# Patient Record
Sex: Male | Born: 1970 | Race: White | Hispanic: No | Marital: Married | Smoking: Never smoker
Health system: Southern US, Community
[De-identification: ages and names within clinical notes are randomized; demographics above are authoritative.]

## PROBLEM LIST (undated history)

## (undated) DIAGNOSIS — Z789 Other specified health status: Secondary | ICD-10-CM

## (undated) HISTORY — PX: OTHER SURGICAL HISTORY: SHX169

## (undated) HISTORY — PX: APPENDECTOMY: SHX54

---

## 2000-08-09 ENCOUNTER — Emergency Department (HOSPITAL_COMMUNITY): Admission: EM | Admit: 2000-08-09 | Discharge: 2000-08-09 | Payer: Self-pay | Admitting: Emergency Medicine

## 2000-08-09 ENCOUNTER — Encounter: Payer: Self-pay | Admitting: Emergency Medicine

## 2005-06-04 ENCOUNTER — Encounter (INDEPENDENT_AMBULATORY_CARE_PROVIDER_SITE_OTHER): Payer: Self-pay | Admitting: Specialist

## 2005-06-04 ENCOUNTER — Inpatient Hospital Stay (HOSPITAL_COMMUNITY): Admission: EM | Admit: 2005-06-04 | Discharge: 2005-06-05 | Payer: Self-pay | Admitting: Emergency Medicine

## 2010-03-29 ENCOUNTER — Encounter: Admission: RE | Admit: 2010-03-29 | Discharge: 2010-03-29 | Payer: Self-pay | Admitting: Otolaryngology
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
## 2011-04-04 NOTE — H&P (Signed)
NAME:  Shannon Norris, Shannon Norris NO.:  [id number]   MEDICAL RECORD NO.:  [id number]          PATIENT TYPE:  EMS   LOCATION:  MAJO                         FACILITY:  [facility]   PHYSICIAN:  Sandria Bales. Ezzard Standing, M.D.  DATE OF BIRTH:  October 21, 1971   DATE OF ADMISSION:  06/03/2005  DATE OF DISCHARGE:                                HISTORY & PHYSICAL   HISTORY OF PRESENT ILLNESS:  This is a 40 year old white male who is a  patient of Dr. Henrine [facility] though apparently he usually goes to the [facility]. He is a little bit unsure of who his physician was. Anyway,  yesterday afternoon about 4 or 5 o'clock he developed sudden abdominal pain  which was mid abdomen. His wife was going to drive [facility] but he passed out in the car and she then diverted him to the  [facility] room.   He had maybe a little nausea after he passed out but no vomiting. He has had  no prior gastrointestinal history. He denies any history of peptic ulcer  disease, liver disease pancreatic disease, colon disease or any prior  abdominal surgery. Because of the peculiar kind of history and presentation,  he underwent a CT scan. I reviewed the CT scan with [facility] and this  shows what appears to be an appendicitis in a retrocecal appendix.   PAST MEDICAL HISTORY:   ALLERGIES:  He has no allergies.   MEDICATIONS:  His only medication includes Zoloft 100 mg daily.   REVIEW OF SYSTEMS:  NEUROLOGIC:  No seizure or loss of consciousness.  PULMONARY:  He does not smoke cigarettes, notes no pneumonia, tuberculosis.  CARDIAC:  He has no history of heart disease, chest pain or hypertension.  GASTROINTESTINAL:  See history of present illness.  UROLOGIC:  No history of kidney infections.   FAMILY HISTORY:  His mother apparently did have appendicitis.   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   PHYSICAL  EXAMINATION:  VITAL SIGNS:  Temperature is 98.1, blood pressure  96/61, pulse is 56.  GENERAL:  He is a red haired, mustached male.  HEENT:  Unremarkable.  NECK:  Supple without mass or thyromegaly.  LUNGS:  Clear to auscultation.  HEART:  Regular rate and rhythm, I hear no murmur or rub.  ABDOMEN:  He is tender with guarding and rebound at the right lower quadrant  to the right flank. He has no hernia.  GENITALIA:  He has no testicular mass or hernia.  EXTREMITIES:  Has good reflexes in all four extremities.  NEUROLOGIC:  He is grossly intact.   LABORATORY DATA:  White blood count is 10,900, hemoglobin 14.7. Sodium 138,  potassium 4.0.   DISCHARGE DIAGNOSIS:  Appendicitis.   Discussed with the patient and his wife the indications, potential  complications of the surgery. I told him that I usually could do this  laparoscopically but there is a chance of open surgery, also the chance the  CT  scan could be wrong and he would have another diagnosis intra-  abdominally. I discussed the risk of bleeding, infection, and postoperative  recovery.       DHN/MEDQ  D:  06/04/2005  T:  06/04/2005  Job:  [id number]   cc:   [facility]. [facility], M.D.  [address]  [place]  [place] [address]  Fax: [phone]
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
## 2011-04-04 NOTE — Op Note (Signed)
[name], Shannon NO.:  [id number]   MEDICAL RECORD NO.:  [id number]          PATIENT TYPE:  INP   LOCATION:  1832                         FACILITY:  [facility]   PHYSICIAN:  Sandria Bales. Ezzard Standing, M.D.  DATE OF BIRTH:  [birth year]   DATE OF PROCEDURE:  06/04/2005  DATE OF DISCHARGE:                                 OPERATIVE REPORT   PREOPERATIVE DIAGNOSIS:  Acute appendicitis.   POSTOPERATIVE DIAGNOSIS:  Acute appendicitis.   PROCEDURE:  Laparoscopic appendectomy.   SURGEON:  Sandria Bales. Ezzard Standing, M.D.   ASSISTANT:  None.   ANESTHESIA:  General endotracheal.   ESTIMATED BLOOD LOSS:  Minimal.   INDICATIONS FOR PROCEDURE:  Mr. Alarie is a 40 year old white male who  has presented with the acute onset of abdominal pain, which is localized to  the right lower quadrant, and a CT scan is consistent with acute  appendicitis.  The patient now comes in for an attempted laparoscopic  appendectomy.  The indications and potential complications were explained to  the patient and to his wife.  The potential complications include but not  limited to bleeding, infection, necessity for open surgery, and the  possibility that it could be another diagnosis.   DESCRIPTION OF PROCEDURE:  The patient was placed in the supine position and  given a general endotracheal anesthetic.  His abdomen was prepped with  Betadine solution and sterilely draped.  A Foley catheter was placed.  The  left arm was tucked.  He was given 1 g of cefoxitin at the initiation of the  procedure.  An infra-umbilical incision was made, and with sharp dissection  was carried down to the abdominal cavity.  This was a Hasson trocar.  A 5 mm  trocar was placed in the right subcostal location.  A 10 mm trocar in the  left lower quadrant.   An abdominal exploration was carried out, revealing the right and left lobes  of the liver were unremarkable.  The anterior wall of the stomach  unremarkable.  The patient has  a very tiny left inguinal  hernia which has  probably not been symptomatic and may never be.   The patient had an inflammatory mass in the right lower quadrant consistent  with acute appendicitis.  The appendix was grasped.  The appendix was partly  retrocecal and partly attached to the right colonic gutter and side wall.  This was taken down with the harmonic scalpel.  The mesentery of the  appendix divided with the harmonic scalpel.  The base of the appendix was  divided with a 30 mm Endo-GI vascular staple.  The appendix was placed into  an Endo-Catch bag and delivered through the umbilicus.  The wound was then  irrigated.  Hemostasis again controlled with the harmonic scalpel.  Each  trocar was removed in turn without bleeding from the trocar sites.  The  umbilical port was closed with a #0 Vicryl suture.  The skin site was closed  with a #4-0 Vicryl suture and painted with tincture of Benzoin and Steri-  Strips.   The patient tolerated the procedure well.  He was transported to the  recovery room in good condition.  The sponge and needle counts were correct  at the end of the case.      Sandria Bales. Ezzard Standing, M.D.  Electronically Signed     DHN/MEDQ  D:  06/04/2005  T:  06/04/2005  Job:  [id number]   cc:   Chales Salmon. Abigail Miyamoto, M.D.  [address]  [place]  [place] [address]  Fax: [phone]

## 2011-07-29 ENCOUNTER — Other Ambulatory Visit: Payer: Self-pay | Admitting: Family Medicine

## 2016-10-20 ENCOUNTER — Ambulatory Visit: Payer: Self-pay | Admitting: Orthopedic Surgery

## 2016-10-31 ENCOUNTER — Ambulatory Visit: Payer: Self-pay | Admitting: Orthopedic Surgery

## 2016-10-31 NOTE — H&P (Signed)
[facility] is an 45 y.o. male.   Chief Complaint: back and RLE pain HPI: The patient is a 45 year old male who presents today for follow up of their back. The patient is being followed for their low back symptoms. They are now 9 week(s) out from injury. Symptoms reported today include: pain (low back) and leg pain (right). Current treatment includes: NSAIDs (Vimovo) and Flexeril. The following medication has been used for pain control: antiinflammatory medication. The patient reports their current pain level to be 5 / 10 (but can be 10/10). The patient has not gotten any relief of their symptoms with corticosteroid use or physical therapy.  [facility] reports his epidural is scheduled December 6. He said he has tried therapy medications. He is miserable. He does not want to wait and undergo an epidural. It radiates down in the legs. He is occasionally tripping.  Review of systems is negative for fevers, chest pain, shortness of breath, unexplained recent weight loss, loss of bowel or bladder function, burning with urination, joint swelling, rashes, weakness or numbness, difficulty with balance, easy bruising, excessive thirst or frequent urination.  No past medical history on file.  No past surgical history on file.  No family history on file. Social History:  has no tobacco, alcohol, and drug history on file.  Allergies: Allergies not on file   (Not in a hospital admission)  No results found for this or any previous visit (from the past 48 hour(s)). No results found.  Review of Systems  Constitutional: Negative.   HENT: Negative.   Eyes: Negative.   Respiratory: Negative.   Cardiovascular: Negative.   Gastrointestinal: Negative.   Genitourinary: Negative.   Musculoskeletal: Positive for back pain.  Skin: Negative.   Neurological: Positive for sensory change and focal weakness.  Psychiatric/Behavioral: Negative.     There were no vitals taken for this  visit. Physical Exam  Constitutional: He is oriented to person, place, and time. He appears well-developed.  HENT:  Head: Normocephalic.  Eyes: Pupils are equal, round, and reactive to light.  Neck: Normal range of motion.  Cardiovascular: Normal rate.   Respiratory: Effort normal.  GI: Soft.  Musculoskeletal:  On exam, he is healthy, moderate distress. Mood and affect is appropriate. Straight leg raise on the right produces buttock, thigh, and calf pain. Negative on the left. EHL is 4+/5 on the right compared to the left. He has pain with flexion and extension of the lumbar spine.  Lumbar spine exam reveals no evidence of soft tissue swelling, deformity or skin ecchymosis. On palpation there is no tenderness of the lumbar spine. No flank pain with percussion. The abdomen is soft and nontender. Nontender over the trochanters. No cellulitis or lymphadenopathy.  Straight leg raise on the right produces buttock, thigh, and calf pain. Negative on the left. EHL is 4+/5 on the right compared to the left. He has pain with flexion and extension of the lumbar spine. Motor is 5/5 including tibialis anterior, plantar flexion, quadriceps and hamstrings. Patient is normoreflexic. There is no Babinski or clonus. Sensory exam is intact to light touch. Patient has good distal pulses. No DVT. No pain and normal range of motion without instability of the hips, knees and ankles.  Neurological: He is alert and oriented to person, place, and time.  Skin: Skin is warm and dry.    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 Some disk degeneration noted as well. Physical therapy note was reviewed as  well.  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  As previously discussed, we discussed microlumbar decompression. I had an extensive discussion of the risks and benefits of the lumbar  decompression with the patient including bleeding, infection, damage to neurovascular structures, epidural fibrosis, CSF leak requiring repair. We also discussed increase in pain, adjacent segment disease, recurrent disc herniation, need for future surgery including repeat decompression and/or fusion. We also discussed risks of postoperative hematoma, paralysis, anesthetic complications including DVT, PE, death, cardiopulmonary dysfunction. In addition, the perioperative and postoperative courses were discussed in detail including the rehabilitative time and return to functional activity and work. I provided the patient with an illustrated handout and utilized the appropriate surgical models.  He has had over nine weeks of symptoms, failure of conservative treatment, activity modification, analgesics, presence of a neural compressive lesion L4-L5 and disc herniations, it is reasonable to proceed with microlumbar decompression. We discussed the postoperative course in detail and the postoperative restrictions and specifically the concern about recurrent disc herniation, progressive disc degeneration requiring fusion. I will proceed as soon as possible.  Plan microlumbar decompression L4-5 right  [facility], Shannon SparkLYN M., PA-C for Dr. Shelle IronBeane 10/31/2016, 9:00 AM

## 2016-11-14 ENCOUNTER — Encounter (HOSPITAL_COMMUNITY): Payer: Self-pay

## 2016-11-14 ENCOUNTER — Encounter (HOSPITAL_COMMUNITY)
Admission: RE | Admit: 2016-11-14 | Discharge: 2016-11-14 | Disposition: A | Discharge status: Home / Self Care | Payer: BLUE CROSS/BLUE SHIELD | Source: Ambulatory Visit | Attending: Specialist | Admitting: Specialist

## 2016-11-14 ENCOUNTER — Ambulatory Visit (HOSPITAL_COMMUNITY)
Admission: RE | Admit: 2016-11-14 | Discharge: 2016-11-14 | Disposition: A | Discharge status: Home / Self Care | Payer: BLUE CROSS/BLUE SHIELD | Source: Ambulatory Visit | Attending: Orthopedic Surgery | Admitting: Orthopedic Surgery

## 2016-11-14 DIAGNOSIS — M5126 Other intervertebral disc displacement, lumbar region: Secondary | ICD-10-CM | POA: Diagnosis not present

## 2016-11-14 DIAGNOSIS — Z01818 Encounter for other preprocedural examination: Secondary | ICD-10-CM | POA: Diagnosis present

## 2016-11-14 DIAGNOSIS — Z01812 Encounter for preprocedural laboratory examination: Secondary | ICD-10-CM | POA: Insufficient documentation

## 2016-11-14 HISTORY — DX: Other specified health status: Z78.9

## 2016-11-14 LAB — CBC
HCT: 43.8 % (ref 39.0–52.0)
Hemoglobin: 15.1 g/dL (ref 13.0–17.0)
MCH: 30.9 pg (ref 26.0–34.0)
MCHC: 34.5 g/dL (ref 30.0–36.0)
MCV: 89.8 fL (ref 78.0–100.0)
PLATELETS: 199 10*3/uL (ref 150–400)
RBC: 4.88 MIL/uL (ref 4.22–5.81)
RDW: 12.7 % (ref 11.5–15.5)
WBC: 4.4 10*3/uL (ref 4.0–10.5)

## 2016-11-14 LAB — BASIC METABOLIC PANEL
Anion gap: 7 (ref 5–15)
BUN: 19 mg/dL (ref 6–20)
CALCIUM: 8.9 mg/dL (ref 8.9–10.3)
CHLORIDE: 108 mmol/L (ref 101–111)
CO2: 26 mmol/L (ref 22–32)
CREATININE: 0.83 mg/dL (ref 0.61–1.24)
GFR calc non Af Amer: >60 mL/min (ref >60)
GLUCOSE: 99 mg/dL (ref 65–99)
Potassium: 4.4 mmol/L (ref 3.5–5.1)
Sodium: 141 mmol/L (ref 135–145)

## 2016-11-14 LAB — SURGICAL PCR SCREEN
MRSA, PCR: NEGATIVE
Staphylococcus aureus: NEGATIVE

## 2016-11-14 LAB — ABO/RH: ABO/RH(D): AB POS

## 2016-11-14 NOTE — Patient Instructions (Signed)
Shannon Norris  11/14/2016   Your procedure is scheduled on: Wednesday November 19, 2016  Report [facility]  Entrance [place]  elevators to 3rd floor to  Short Stay [facility] at 6:30 AM.  Call this number if you have problems the morning of surgery [phone]   Remember: ONLY 1 PERSON MAY GO WITH YOU TO SHORT STAY TO GET  READY MORNING OF YOUR SURGERY.  Do not eat food or drink liquids :After Midnight.     Take these medicines the morning of surgery: NONE                                You may not have any metal on your body including hair pins and              piercings  Do not wear jewelry, lotions, powders or colognes, deodorant              Men may shave face and neck.   Do not bring valuables to the hospital.  IS NOT             RESPONSIBLE   FOR VALUABLES.  Contacts, dentures or bridgework may not be worn into surgery.  Leave suitcase in the car. After surgery it may be brought to your room.                Please read over the following fact sheets you were given:MRSA INFORMATION SHEET; INCENTIVE SPIROMETER  _____________________________________________________________________             [facility] Pre[facility] Before surgery, you can play an important role.  Because skin is not sterile, your skin needs to be as free of germs as possible.  You can reduce the number of germs on your skin by washing with CHG (chlorahexidine gluconate) soap before surgery.  CHG is an antiseptic cleaner which kills germs and bonds with the skin to continue killing germs even after washing. Please DO NOT use if you have an allergy to CHG or antibacterial soaps.  If your skin becomes reddened/irritated stop using the CHG and inform your nurse when you arrive at Short Stay. Do not shave (including legs and underarms) for at least 48 hours prior to the first CHG shower.  You may shave your face/neck. Please follow these instructions  carefully:  1.  Shower with CHG Soap the night before surgery and the  morning of Surgery.  2.  If you choose to wash your hair, wash your hair first as usual with your  normal  shampoo.  3.  After you shampoo, rinse your hair and body thoroughly to remove the  shampoo.                           4.  Use CHG as you would any other liquid soap.  You can apply chg directly  to the skin and wash                       Gently with a scrungie or clean washcloth.  5.  Apply the CHG Soap to your body ONLY FROM THE NECK DOWN.   Do not use on face/ open  Wound or open sores. Avoid contact with eyes, ears mouth and genitals (private parts).                       Wash face,  Genitals (private parts) with your normal soap.             6.  Wash thoroughly, paying special attention to the area where your surgery  will be performed.  7.  Thoroughly rinse your body with warm water from the neck down.  8.  DO NOT shower/wash with your normal soap after using and rinsing off  the CHG Soap.                9.  Pat yourself dry with a clean towel.            10.  Wear clean pajamas.            11.  Place clean sheets on your bed the night of your first shower and do not  sleep with pets. Day of Surgery : Do not apply any lotions/deodorants the morning of surgery.  Please wear clean clothes to the hospital/surgery [facility].  FAILURE TO FOLLOW THESE INSTRUCTIONS MAY RESULT IN THE CANCELLATION OF YOUR SURGERY PATIENT SIGNATURE_________________________________  NURSE SIGNATURE__________________________________  ________________________________________________________________________   Shannon [facility]  An incentive spirometer is a tool that can help keep your lungs clear and active. This tool measures how well you are filling your lungs with each breath. Taking long deep breaths may help reverse or decrease the chance of developing breathing (pulmonary) problems (especially infection)  following:  A long period of time when you are unable to move or be active. BEFORE THE PROCEDURE   If the spirometer includes an indicator to show your best effort, your nurse or respiratory therapist will set it to a desired goal.  If possible, sit up straight or lean slightly forward. Try not to slouch.  Hold the incentive spirometer in an upright position. INSTRUCTIONS FOR USE  1. Sit on the edge of your bed if possible, or sit up as far as you can in bed or on a chair. 2. Hold the incentive spirometer in an upright position. 3. Breathe out normally. 4. Place the mouthpiece in your mouth and seal your lips tightly around it. 5. Breathe in slowly and as deeply as possible, raising the piston or the ball toward the top of the column. 6. Hold your breath for 3-5 seconds or for as long as possible. Allow the piston or ball to fall to the bottom of the column. 7. Remove the mouthpiece from your mouth and breathe out normally. 8. Rest for a few seconds and repeat Steps 1 through 7 at least 10 times every 1-2 hours when you are awake. Take your time and take a few normal breaths between deep breaths. 9. The spirometer may include an indicator to show your best effort. Use the indicator as a goal to work toward during each repetition. 10. After each set of 10 deep breaths, practice coughing to be sure your lungs are clear. If you have an incision (the cut made at the time of surgery), support your incision when coughing by placing a pillow or rolled up towels firmly against it. Once you are able to get out of bed, walk around indoors and cough well. You may stop using the incentive spirometer when instructed by your caregiver.  RISKS AND COMPLICATIONS  Take your time so you do not get  dizzy or light-headed.  If you are in pain, you may need to take or ask for pain medication before doing incentive spirometry. It is harder to take a deep breath if you are having pain. AFTER USE  Rest and  breathe slowly and easily.  It can be helpful to keep track of a log of your progress. Your caregiver can provide you with a simple table to help with this. If you are using the spirometer at home, follow these instructions: [place] IF:   You are having difficultly using the spirometer.  You have trouble using the spirometer as often as instructed.  Your pain medication is not giving enough relief while using the spirometer.  You develop fever of 100.5 F (38.1 C) or higher. SEEK IMMEDIATE MEDICAL CARE IF:   You cough up bloody sputum that had not been present before.  You develop fever of 102 F (38.9 C) or greater.  You develop worsening pain at or near the incision site. MAKE SURE YOU:   Understand these instructions.  Will watch your condition.  Will get help right away if you are not doing well or get worse. Document Released: 03/16/2007 Document Revised: 01/26/2012 Document Reviewed: 05/17/2007 [facility] Patient Information 2014 [place], [place].   ________________________________________________________________________

## 2016-11-18 NOTE — Anesthesia Preprocedure Evaluation (Addendum)
Anesthesia Evaluation  Patient identified by MRN, date of birth, ID band Patient awake    Reviewed: Allergy & Precautions, NPO status , Patient's Chart, lab work & pertinent test results  Airway Mallampati: II  TM Distance: >3 FB Neck ROM: Full    Dental  (+) Teeth Intact, Dental Advisory Given   Pulmonary neg pulmonary ROS,    Pulmonary exam normal breath sounds clear to auscultation       Cardiovascular Exercise Tolerance: Good negative cardio ROS Normal cardiovascular exam Rhythm:Regular Rate:Normal     Neuro/Psych  HNP L4-L5 Right negative psych ROS   GI/Hepatic negative GI ROS, Neg liver ROS,   Endo/Other  Obesity   Renal/GU negative Renal ROS     Musculoskeletal negative musculoskeletal ROS (+)   Abdominal   Peds  Hematology negative hematology ROS (+)   Anesthesia Other Findings Day of surgery medications reviewed with the patient.  Reproductive/Obstetrics                            Anesthesia Physical Anesthesia Plan  ASA: II  Anesthesia Plan: General   Post-op Pain Management:    Induction: Intravenous  Airway Management Planned: Oral ETT  Additional Equipment:   Intra-op Plan:   Post-operative Plan: Extubation in OR  Informed Consent: I have reviewed the patients History and Physical, chart, labs and discussed the procedure including the risks, benefits and alternatives for the proposed anesthesia with the patient or authorized representative who has indicated his/her understanding and acceptance.   Dental advisory given  Plan Discussed with: CRNA  Anesthesia Plan Comments: (Risks/benefits of general anesthesia discussed with patient including risk of damage to teeth, lips, gum, and tongue, nausea/vomiting, allergic reactions to medications, and the possibility of heart attack, stroke and death.  All patient questions answered.  Patient wishes to proceed.)         Anesthesia Quick Evaluation

## 2016-11-19 ENCOUNTER — Ambulatory Visit (HOSPITAL_COMMUNITY): Payer: BLUE CROSS/BLUE SHIELD | Admitting: Anesthesiology

## 2016-11-19 ENCOUNTER — Encounter (HOSPITAL_COMMUNITY): Payer: Self-pay | Admitting: Certified Registered Nurse Anesthetist

## 2016-11-19 ENCOUNTER — Ambulatory Visit (HOSPITAL_COMMUNITY): Payer: BLUE CROSS/BLUE SHIELD

## 2016-11-19 ENCOUNTER — Encounter (HOSPITAL_COMMUNITY)
Admission: RE | Disposition: A | Discharge status: Home / Self Care | Payer: Self-pay | Source: Ambulatory Visit | Attending: Specialist

## 2016-11-19 ENCOUNTER — Ambulatory Visit (HOSPITAL_COMMUNITY)
Admission: RE | Admit: 2016-11-19 | Discharge: 2016-11-19 | Disposition: A | Discharge status: Home / Self Care | Payer: BLUE CROSS/BLUE SHIELD | Source: Ambulatory Visit | Attending: Specialist | Admitting: Specialist

## 2016-11-19 DIAGNOSIS — M5126 Other intervertebral disc displacement, lumbar region: Secondary | ICD-10-CM | POA: Diagnosis present

## 2016-11-19 DIAGNOSIS — M48061 Spinal stenosis, lumbar region without neurogenic claudication: Secondary | ICD-10-CM | POA: Diagnosis present

## 2016-11-19 DIAGNOSIS — Z6831 Body mass index (BMI) 31.0-31.9, adult: Secondary | ICD-10-CM | POA: Diagnosis not present

## 2016-11-19 DIAGNOSIS — Z419 Encounter for procedure for purposes other than remedying health state, unspecified: Secondary | ICD-10-CM

## 2016-11-19 DIAGNOSIS — E669 Obesity, unspecified: Secondary | ICD-10-CM | POA: Insufficient documentation

## 2016-11-19 HISTORY — PX: LUMBAR LAMINECTOMY/DECOMPRESSION MICRODISCECTOMY: SHX5026

## 2016-11-19 LAB — TYPE AND SCREEN
ABO/RH(D): AB POS
Antibody Screen: NEGATIVE

## 2016-11-19 SURGERY — LUMBAR LAMINECTOMY/DECOMPRESSION MICRODISCECTOMY 1 LEVEL
Anesthesia: General | Site: Back | Laterality: Right

## 2016-11-19 MED ORDER — SODIUM CHLORIDE 0.9 % IR SOLN
Status: DC | PRN
  Administered 2016-11-19: 500 mL

## 2016-11-19 MED ORDER — MIDAZOLAM HCL 5 MG/5ML IJ SOLN
INTRAMUSCULAR | Status: DC | PRN
  Administered 2016-11-19: 2 mg via INTRAVENOUS

## 2016-11-19 MED ORDER — ROCURONIUM BROMIDE 50 MG/5ML IV SOSY
PREFILLED_SYRINGE | INTRAVENOUS | Status: AC
  Filled 2016-11-19: qty 5

## 2016-11-19 MED ORDER — PHENOL 1.4 % MT LIQD: 1.0000 | OROMUCOSAL | Status: DC | PRN

## 2016-11-19 MED ORDER — HYDROMORPHONE HCL 1 MG/ML IJ SOLN: 0.5000 mg | INTRAMUSCULAR | Status: DC | PRN

## 2016-11-19 MED ORDER — SUCCINYLCHOLINE CHLORIDE 200 MG/10ML IV SOSY
PREFILLED_SYRINGE | INTRAVENOUS | Status: AC
  Filled 2016-11-19: qty 10

## 2016-11-19 MED ORDER — ONDANSETRON HCL 4 MG/2ML IJ SOLN: 4.0000 mg | INTRAMUSCULAR | Status: DC | PRN

## 2016-11-19 MED ORDER — LIDOCAINE 2% (20 MG/ML) 5 ML SYRINGE
INTRAMUSCULAR | Status: AC
  Filled 2016-11-19: qty 5

## 2016-11-19 MED ORDER — SODIUM CHLORIDE 0.9 % IR SOLN
Status: AC
  Filled 2016-11-19: qty 500000

## 2016-11-19 MED ORDER — CEFAZOLIN SODIUM-DEXTROSE 2-4 GM/100ML-% IV SOLN
INTRAVENOUS | Status: AC
  Filled 2016-11-19: qty 100

## 2016-11-19 MED ORDER — LIDOCAINE 2% (20 MG/ML) 5 ML SYRINGE
INTRAMUSCULAR | Status: DC | PRN
  Administered 2016-11-19: 100 mg via INTRAVENOUS

## 2016-11-19 MED ORDER — ACETAMINOPHEN 650 MG RE SUPP: 650.0000 mg | RECTAL | Status: DC | PRN

## 2016-11-19 MED ORDER — OXYCODONE-ACETAMINOPHEN 5-325 MG PO TABS
1.0000 | ORAL_TABLET | ORAL | Status: DC | PRN
  Administered 2016-11-19: 18:00:00 2 via ORAL
  Administered 2016-11-19: 1 via ORAL
  Filled 2016-11-19: qty 1
  Filled 2016-11-19: qty 2

## 2016-11-19 MED ORDER — METHOCARBAMOL 500 MG PO TABS
500.0000 mg | ORAL_TABLET | Freq: Four times a day (QID) | ORAL | Status: DC | PRN
  Administered 2016-11-19: 17:00:00 500 mg via ORAL
  Filled 2016-11-19: qty 1

## 2016-11-19 MED ORDER — DOCUSATE SODIUM 100 MG PO CAPS: 100.0000 mg | ORAL_CAPSULE | Freq: Two times a day (BID) | ORAL | 1 refills | Status: AC | PRN

## 2016-11-19 MED ORDER — METHOCARBAMOL 1000 MG/10ML IJ SOLN
500.0000 mg | Freq: Four times a day (QID) | INTRAVENOUS | Status: DC | PRN
  Administered 2016-11-19: 500 mg via INTRAVENOUS
  Filled 2016-11-19: qty 5
  Filled 2016-11-19: qty 550

## 2016-11-19 MED ORDER — DOCUSATE SODIUM 100 MG PO CAPS: 100.0000 mg | ORAL_CAPSULE | Freq: Two times a day (BID) | ORAL | Status: DC

## 2016-11-19 MED ORDER — ROCURONIUM BROMIDE 50 MG/5ML IV SOSY
PREFILLED_SYRINGE | INTRAVENOUS | Status: DC | PRN
  Administered 2016-11-19: 10 mg via INTRAVENOUS
  Administered 2016-11-19: 40 mg via INTRAVENOUS

## 2016-11-19 MED ORDER — CEFAZOLIN SODIUM-DEXTROSE 2-4 GM/100ML-% IV SOLN
2.0000 g | INTRAVENOUS | Status: AC
  Administered 2016-11-19: 2 g via INTRAVENOUS

## 2016-11-19 MED ORDER — THROMBIN 5000 UNITS EX SOLR
OROMUCOSAL | Status: DC | PRN
  Administered 2016-11-19: 10:00:00 via TOPICAL

## 2016-11-19 MED ORDER — RISAQUAD PO CAPS
1.0000 | ORAL_CAPSULE | Freq: Every day | ORAL | Status: DC
  Administered 2016-11-19: 17:00:00 1 via ORAL
  Filled 2016-11-19: qty 1

## 2016-11-19 MED ORDER — MENTHOL 3 MG MT LOZG: 1.0000 | LOZENGE | OROMUCOSAL | Status: DC | PRN

## 2016-11-19 MED ORDER — BISACODYL 5 MG PO TBEC: 5.0000 mg | DELAYED_RELEASE_TABLET | Freq: Every day | ORAL | Status: DC | PRN

## 2016-11-19 MED ORDER — FENTANYL CITRATE (PF) 100 MCG/2ML IJ SOLN
25.0000 ug | INTRAMUSCULAR | Status: DC | PRN
  Administered 2016-11-19 (×2): 50 ug via INTRAVENOUS

## 2016-11-19 MED ORDER — DEXAMETHASONE SODIUM PHOSPHATE 10 MG/ML IJ SOLN
INTRAMUSCULAR | Status: DC | PRN
  Administered 2016-11-19: 10 mg via INTRAVENOUS

## 2016-11-19 MED ORDER — LACTATED RINGERS IV SOLN
INTRAVENOUS | Status: DC
  Administered 2016-11-19: 08:00:00 via INTRAVENOUS

## 2016-11-19 MED ORDER — CEFAZOLIN SODIUM-DEXTROSE 2-4 GM/100ML-% IV SOLN
2.0000 g | Freq: Three times a day (TID) | INTRAVENOUS | Status: DC
  Administered 2016-11-19: 17:00:00 2 g via INTRAVENOUS
  Filled 2016-11-19: qty 100

## 2016-11-19 MED ORDER — MIDAZOLAM HCL 2 MG/2ML IJ SOLN
INTRAMUSCULAR | Status: AC
  Filled 2016-11-19: qty 2

## 2016-11-19 MED ORDER — FENTANYL CITRATE (PF) 100 MCG/2ML IJ SOLN
INTRAMUSCULAR | Status: AC
  Filled 2016-11-19: qty 2

## 2016-11-19 MED ORDER — POLYETHYLENE GLYCOL 3350 17 G PO PACK: 17.0000 g | PACK | Freq: Every day | ORAL | 0 refills | Status: AC

## 2016-11-19 MED ORDER — LIDOCAINE-EPINEPHRINE (PF) 1 %-1:200000 IJ SOLN
INTRAMUSCULAR | Status: AC
  Filled 2016-11-19: qty 30

## 2016-11-19 MED ORDER — ONDANSETRON HCL 4 MG/2ML IJ SOLN
INTRAMUSCULAR | Status: DC | PRN
  Administered 2016-11-19: 4 mg via INTRAVENOUS

## 2016-11-19 MED ORDER — METHOCARBAMOL 500 MG PO TABS: 500.0000 mg | ORAL_TABLET | Freq: Four times a day (QID) | ORAL | 1 refills | Status: AC | PRN

## 2016-11-19 MED ORDER — SUCCINYLCHOLINE CHLORIDE 200 MG/10ML IV SOSY
PREFILLED_SYRINGE | INTRAVENOUS | Status: DC | PRN
  Administered 2016-11-19: 120 mg via INTRAVENOUS

## 2016-11-19 MED ORDER — PROPOFOL 10 MG/ML IV BOLUS
INTRAVENOUS | Status: AC
  Filled 2016-11-19: qty 20

## 2016-11-19 MED ORDER — MAGNESIUM CITRATE PO SOLN: 1.0000 | Freq: Once | ORAL | Status: DC | PRN

## 2016-11-19 MED ORDER — ACETAMINOPHEN 325 MG PO TABS: 650.0000 mg | ORAL_TABLET | ORAL | Status: DC | PRN

## 2016-11-19 MED ORDER — OXYCODONE-ACETAMINOPHEN 10-325 MG PO TABS: 1.0000 | ORAL_TABLET | ORAL | 0 refills | Status: AC | PRN

## 2016-11-19 MED ORDER — EPHEDRINE SULFATE 50 MG/ML IJ SOLN
INTRAMUSCULAR | Status: DC | PRN
  Administered 2016-11-19: 5 mg via INTRAVENOUS
  Administered 2016-11-19 (×2): 10 mg via INTRAVENOUS

## 2016-11-19 MED ORDER — FENTANYL CITRATE (PF) 100 MCG/2ML IJ SOLN
INTRAMUSCULAR | Status: DC | PRN
  Administered 2016-11-19 (×4): 50 ug via INTRAVENOUS

## 2016-11-19 MED ORDER — ALUM & MAG HYDROXIDE-SIMETH 200-200-20 MG/5ML PO SUSP: 30.0000 mL | Freq: Four times a day (QID) | ORAL | Status: DC | PRN

## 2016-11-19 MED ORDER — SUGAMMADEX SODIUM 200 MG/2ML IV SOLN
INTRAVENOUS | Status: DC | PRN
  Administered 2016-11-19: 200 mg via INTRAVENOUS

## 2016-11-19 MED ORDER — DEXAMETHASONE SODIUM PHOSPHATE 10 MG/ML IJ SOLN
INTRAMUSCULAR | Status: AC
  Filled 2016-11-19: qty 1

## 2016-11-19 MED ORDER — KCL IN DEXTROSE-NACL 20-5-0.45 MEQ/L-%-% IV SOLN
INTRAVENOUS | Status: DC
  Filled 2016-11-19: qty 1000

## 2016-11-19 MED ORDER — HYDROCODONE-ACETAMINOPHEN 5-325 MG PO TABS: 1.0000 | ORAL_TABLET | ORAL | Status: DC | PRN

## 2016-11-19 MED ORDER — THROMBIN 5000 UNITS EX SOLR
CUTANEOUS | Status: AC
  Filled 2016-11-19: qty 10000

## 2016-11-19 MED ORDER — PROPOFOL 10 MG/ML IV BOLUS
INTRAVENOUS | Status: DC | PRN
  Administered 2016-11-19: 200 mg via INTRAVENOUS

## 2016-11-19 MED ORDER — PROMETHAZINE HCL 25 MG/ML IJ SOLN: 6.2500 mg | INTRAMUSCULAR | Status: DC | PRN

## 2016-11-19 MED ORDER — BUPIVACAINE HCL 0.25 % IJ SOLN
INTRAMUSCULAR | Status: AC
  Filled 2016-11-19: qty 1

## 2016-11-19 MED ORDER — POLYETHYLENE GLYCOL 3350 17 G PO PACK: 17.0000 g | PACK | Freq: Every day | ORAL | Status: DC | PRN

## 2016-11-19 MED ORDER — LIDOCAINE-EPINEPHRINE (PF) 1 %-1:200000 IJ SOLN
INTRAMUSCULAR | Status: DC | PRN
  Administered 2016-11-19: 10 mL

## 2016-11-19 MED ORDER — ONDANSETRON HCL 4 MG/2ML IJ SOLN
INTRAMUSCULAR | Status: AC
  Filled 2016-11-19: qty 2

## 2016-11-19 MED ORDER — SUGAMMADEX SODIUM 200 MG/2ML IV SOLN
INTRAVENOUS | Status: AC
Start: 2016-11-19 — End: 2016-11-19
  Filled 2016-11-19: qty 2

## 2016-11-19 MED ORDER — FENTANYL CITRATE (PF) 100 MCG/2ML IJ SOLN
INTRAMUSCULAR | Status: AC
  Filled 2016-11-19: qty 4

## 2016-11-19 MED ORDER — GABAPENTIN 300 MG PO CAPS: 300.0000 mg | ORAL_CAPSULE | Freq: Three times a day (TID) | ORAL | Status: DC | PRN

## 2016-11-19 SURGICAL SUPPLY — 51 items
AGENT HMST SPONGE THK3/8 (HEMOSTASIS)
BAG SPEC THK2 15X12 ZIP CLS (MISCELLANEOUS)
BAG ZIPLOCK 12X15 (MISCELLANEOUS) IMPLANT
CLEANER TIP ELECTROSURG 2X2 (MISCELLANEOUS) ×3 IMPLANT
CLOSURE WOUND 1/2 X4 (GAUZE/BANDAGES/DRESSINGS) ×1
CLOTH 2% CHLOROHEXIDINE 3PK (PERSONAL CARE ITEMS) ×3 IMPLANT
DRAPE MICROSCOPE LEICA (MISCELLANEOUS) ×3 IMPLANT
DRAPE POUCH INSTRU U-SHP 10X18 (DRAPES) ×3 IMPLANT
DRAPE SHEET LG 3/4 BI-LAMINATE (DRAPES) ×3 IMPLANT
DRAPE SURG 17X11 SM STRL (DRAPES) ×3 IMPLANT
DRAPE UTILITY XL STRL (DRAPES) ×5 IMPLANT
DRSG AQUACEL AG ADV 3.5X 4 (GAUZE/BANDAGES/DRESSINGS) ×2 IMPLANT
DRSG AQUACEL AG ADV 3.5X 6 (GAUZE/BANDAGES/DRESSINGS) IMPLANT
DURAPREP 26ML APPLICATOR (WOUND CARE) ×3 IMPLANT
DURASEAL SPINE SEALANT 3ML (MISCELLANEOUS) IMPLANT
ELECT BLADE TIP CTD 4 INCH (ELECTRODE) IMPLANT
ELECT REM PT RETURN 9FT ADLT (ELECTROSURGICAL) ×3
ELECTRODE REM PT RTRN 9FT ADLT (ELECTROSURGICAL) ×1 IMPLANT
GLOVE BIOGEL PI IND STRL 7.0 (GLOVE) ×1 IMPLANT
GLOVE BIOGEL PI INDICATOR 7.0 (GLOVE) ×2
GLOVE SURG SS PI 7.0 STRL IVOR (GLOVE) ×3 IMPLANT
GLOVE SURG SS PI 7.5 STRL IVOR (GLOVE) ×3 IMPLANT
GLOVE SURG SS PI 8.0 STRL IVOR (GLOVE) ×6 IMPLANT
GOWN STRL REUS W/TWL XL LVL3 (GOWN DISPOSABLE) ×6 IMPLANT
HEMOSTAT SPONGE AVITENE ULTRA (HEMOSTASIS) IMPLANT
IV CATH 14GX2 1/4 (CATHETERS) ×3 IMPLANT
KIT BASIN OR (CUSTOM PROCEDURE TRAY) ×3 IMPLANT
KIT POSITIONING SURG ANDREWS (MISCELLANEOUS) ×3 IMPLANT
MANIFOLD NEPTUNE II (INSTRUMENTS) ×3 IMPLANT
NDL SPNL 18GX3.5 QUINCKE PK (NEEDLE) ×2 IMPLANT
NEEDLE SPNL 18GX3.5 QUINCKE PK (NEEDLE) ×6 IMPLANT
PACK LAMINECTOMY ORTHO (CUSTOM PROCEDURE TRAY) ×3 IMPLANT
PATTIES SURGICAL .5 X.5 (GAUZE/BANDAGES/DRESSINGS) IMPLANT
PATTIES SURGICAL .75X.75 (GAUZE/BANDAGES/DRESSINGS) ×2 IMPLANT
PATTIES SURGICAL 1X1 (DISPOSABLE) IMPLANT
RUBBERBAND STERILE (MISCELLANEOUS) ×6 IMPLANT
SPONGE SURGIFOAM ABS GEL 100 (HEMOSTASIS) ×3 IMPLANT
STAPLER VISISTAT (STAPLE) IMPLANT
STRIP CLOSURE SKIN 1/2X4 (GAUZE/BANDAGES/DRESSINGS) ×2 IMPLANT
SUT NURALON 4 0 TR CR/8 (SUTURE) IMPLANT
SUT PROLENE 3 0 PS 2 (SUTURE) ×3 IMPLANT
SUT VIC AB 1 CT1 27 (SUTURE)
SUT VIC AB 1 CT1 27XBRD ANTBC (SUTURE) IMPLANT
SUT VIC AB 1-0 CT2 27 (SUTURE) ×3 IMPLANT
SUT VIC AB 2-0 CT1 27 (SUTURE)
SUT VIC AB 2-0 CT1 TAPERPNT 27 (SUTURE) IMPLANT
SUT VIC AB 2-0 CT2 27 (SUTURE) ×3 IMPLANT
SYR 3ML LL SCALE MARK (SYRINGE) IMPLANT
TOWEL OR 17X26 10 PK STRL BLUE (TOWEL DISPOSABLE) ×3 IMPLANT
TOWEL OR NON WOVEN STRL DISP B (DISPOSABLE) ×2 IMPLANT
YANKAUER SUCT BULB TIP NO VENT (SUCTIONS) IMPLANT

## 2016-11-19 NOTE — Brief Op Note (Signed)
11/19/2016  9:53 AM  PATIENT:  Gifford ShaveScot R Fitzgerald  46 y.o. male  PRE-OPERATIVE DIAGNOSIS:  HNP L4-L5 Right  POST-OPERATIVE DIAGNOSIS:  HNP L4-L5 right  PROCEDURE:  Procedure(s): MICRO LUMBAR DECOMPRESSION L4 - L5 RIGHT 1 LEVEL (Right)  SURGEON:  Surgeon(s) and Role:    * Jene EveryJeffrey Vivaan Helseth, MD - Primary  PHYSICIAN ASSISTANT:   ASSISTANTS: [place]   ANESTHESIA:   general  EBL:  Total I/O In: -  Out: 25 [Blood:25]  BLOOD ADMINISTERED:none  DRAINS: none   LOCAL MEDICATIONS USED:  XYLOCAINE   SPECIMEN:  Source of Specimen:  L45  DISPOSITION OF SPECIMEN:  PATHOLOGY  COUNTS:  YES  TOURNIQUET:  * No tourniquets in log *  DICTATION: .Other Dictation: Dictation Number [id number]  PLAN OF CARE: Admit for overnight observation  PATIENT DISPOSITION:  PACU - hemodynamically stable.   Delay start of Pharmacological VTE agent (>24hrs) due to surgical blood loss or risk of bleeding: yes

## 2016-11-19 NOTE — Interval H&P Note (Signed)
History and Physical Interval Note:  11/19/2016 6:30 AM  Shannon Norris  has presented today for surgery, with the diagnosis of HNP L4-L5 Right  The various methods of treatment have been discussed with the patient and family. After consideration of risks, benefits and other options for treatment, the patient has consented to  Procedure(s): MICRO LUMBAR DECOMPRESSION L4 - L5 RIGHT 1 LEVEL (Right) as a surgical intervention .  The patient's history has been reviewed, patient examined, no change in status, stable for surgery.  I have reviewed the patient's chart and labs.  Questions were answered to the patient's satisfaction.     Shonique Pelphrey C

## 2016-11-19 NOTE — Transfer of Care (Signed)
Immediate Anesthesia Transfer of Care Note  Patient: Shannon Norris  Procedure(s) Performed: Procedure(s): MICRO LUMBAR DECOMPRESSION L4 - L5 RIGHT 1 LEVEL (Right)  Patient Location: PACU  Anesthesia Type:General  Level of Consciousness:  sedated, patient cooperative and responds to stimulation  Airway & Oxygen Therapy:Patient Spontanous Breathing and Patient connected to face mask oxgen  Post-op Assessment:  Report given to PACU RN and Post -op Vital signs reviewed and stable  Post vital signs:  Reviewed and stable  Last Vitals:  Vitals:   11/19/16 0635  BP: 136/83  Pulse: 86  Resp: 16  Temp: 37 C    Complications: No apparent anesthesia complications

## 2016-11-19 NOTE — H&P (View-Only) (Signed)
Shannon Norris is an 46 y.o. male.   Chief Complaint: back and RLE pain HPI: The patient is a 46 year old male who presents today for follow up of their back. The patient is being followed for their low back symptoms. They are now 9 week(s) out from injury. Symptoms reported today include: pain (low back) and leg pain (right). Current treatment includes: NSAIDs (Vimovo) and Flexeril. The following medication has been used for pain control: antiinflammatory medication. The patient reports their current pain level to be 5 / 10 (but can be 10/10). The patient has not gotten any relief of their symptoms with corticosteroid use or physical therapy.  Shannon Norris reports his epidural is scheduled December 6. He said he has tried therapy medications. He is miserable. He does not want to wait and undergo an epidural. It radiates down in the legs. He is occasionally tripping.  Review of systems is negative for fevers, chest pain, shortness of breath, unexplained recent weight loss, loss of bowel or bladder function, burning with urination, joint swelling, rashes, weakness or numbness, difficulty with balance, easy bruising, excessive thirst or frequent urination.  No past medical history on file.  No past surgical history on file.  No family history on file. Social History:  has no tobacco, alcohol, and drug history on file.  Allergies: Allergies not on file   (Not in a hospital admission)  No results found for this or any previous visit (from the past 48 hour(s)). No results found.  Review of Systems  Constitutional: Negative.   HENT: Negative.   Eyes: Negative.   Respiratory: Negative.   Cardiovascular: Negative.   Gastrointestinal: Negative.   Genitourinary: Negative.   Musculoskeletal: Positive for back pain.  Skin: Negative.   Neurological: Positive for sensory change and focal weakness.  Psychiatric/Behavioral: Negative.     There were no vitals taken for this  visit. Physical Exam  Constitutional: He is oriented to person, place, and time. He appears well-developed.  HENT:  Head: Normocephalic.  Eyes: Pupils are equal, round, and reactive to light.  Neck: Normal range of motion.  Cardiovascular: Normal rate.   Respiratory: Effort normal.  GI: Soft.  Musculoskeletal:  On exam, he is healthy, moderate distress. Mood and affect is appropriate. Straight leg raise on the right produces buttock, thigh, and calf pain. Negative on the left. EHL is 4+/5 on the right compared to the left. He has pain with flexion and extension of the lumbar spine.  Lumbar spine exam reveals no evidence of soft tissue swelling, deformity or skin ecchymosis. On palpation there is no tenderness of the lumbar spine. No flank pain with percussion. The abdomen is soft and nontender. Nontender over the trochanters. No cellulitis or lymphadenopathy.  Straight leg raise on the right produces buttock, thigh, and calf pain. Negative on the left. EHL is 4+/5 on the right compared to the left. He has pain with flexion and extension of the lumbar spine. Motor is 5/5 including tibialis anterior, plantar flexion, quadriceps and hamstrings. Patient is normoreflexic. There is no Babinski or clonus. Sensory exam is intact to light touch. Patient has good distal pulses. No DVT. No pain and normal range of motion without instability of the hips, knees and ankles.  Neurological: He is alert and oriented to person, place, and time.  Skin: Skin is warm and dry.    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 Some disk degeneration noted as well. Physical therapy note was reviewed as  well.  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  As previously discussed, we discussed microlumbar decompression. I had an extensive discussion of the risks and benefits of the lumbar  decompression with the patient including bleeding, infection, damage to neurovascular structures, epidural fibrosis, CSF leak requiring repair. We also discussed increase in pain, adjacent segment disease, recurrent disc herniation, need for future surgery including repeat decompression and/or fusion. We also discussed risks of postoperative hematoma, paralysis, anesthetic complications including DVT, PE, death, cardiopulmonary dysfunction. In addition, the perioperative and postoperative courses were discussed in detail including the rehabilitative time and return to functional activity and work. I provided the patient with an illustrated handout and utilized the appropriate surgical models.  He has had over nine weeks of symptoms, failure of conservative treatment, activity modification, analgesics, presence of a neural compressive lesion L4-L5 and disc herniations, it is reasonable to proceed with microlumbar decompression. We discussed the postoperative course in detail and the postoperative restrictions and specifically the concern about recurrent disc herniation, progressive disc degeneration requiring fusion. I will proceed as soon as possible.  Plan microlumbar decompression L4-5 right  Dorothy SparkBISSELL, JACLYN M., PA-C for Dr. Shelle IronBeane 10/31/2016, 9:00 AM

## 2016-11-19 NOTE — Discharge Summary (Signed)
Physician Discharge Summary   Patient ID: Shannon Norris MRN: [id number] DOB/AGE: Dec 23, 1970 46 y.o.  Admit date: 11/19/2016 Discharge date: 11/19/2016  Primary Diagnosis:   HNP L4-L5 Right  Admission Diagnoses:  Past Medical History:  Diagnosis Date  . Medical history non-contributory    Discharge Diagnoses:   Principal Problem:   HNP (herniated nucleus pulposus), lumbar Active Problems:   Spinal stenosis of lumbar region  Procedure:  Procedure(s) (LRB): MICRO LUMBAR DECOMPRESSION L4 - L5 RIGHT 1 LEVEL (Right)   Consults: None  HPI:  see H&P    Laboratory Data: Hospital Outpatient Visit on 11/14/2016  Component Date Value Ref Range Status  . ABO/RH(D) 11/14/2016 AB POS   Final   No results for input(s): HGB in the last 72 hours. No results for input(s): WBC, RBC, HCT, PLT in the last 72 hours. No results for input(s): NA, K, CL, CO2, BUN, CREATININE, GLUCOSE, CALCIUM in the last 72 hours. No results for input(s): LABPT, INR in the last 72 hours.  X-Rays:Dg Lumbar Spine 2-3 Views  Addendum Date: 11/18/2016   ADDENDUM REPORT: 11/18/2016 10:24 ADDENDUM: No change in interpretation. Please see presentation state labeled "AP" to load the images where the lumbar spine is labeled in the AP view, if those images do not automatically load. Electronically Signed   By: Elsie Stain M.D.   On: 11/18/2016 10:24   Result Date: 11/18/2016 CLINICAL DATA:  Preoperative film for surgery scheduled November 19, 2016 EXAM: LUMBAR SPINE - 2-3 VIEW COMPARISON:  None. FINDINGS: There is no evidence of lumbar spine fracture. Alignment is normal. There is decreased intervertebral space in the lower thoracic spine. Mild anterior chronic compression deformity of L1 is noted. IMPRESSION: Mild degenerative joint changes as described. Electronically Signed: By: Sherian Rein M.D. On: 11/14/2016 10:35   Dg Spine Portable 1 View  Result Date: 11/19/2016 CLINICAL DATA:  Intraoperative film 3 for  localization EXAM: PORTABLE SPINE - 1 VIEW COMPARISON:  Intraoperative film 2 from today and lumbar spine films of 11/14/2016 FINDINGS: The instrument for localization extends to the L4-5 interspace. No change in alignment is seen. IMPRESSION: Localization of L4-5 on film 3. Electronically Signed   By: Dwyane Dee M.D.   On: 11/19/2016 10:23   Dg Spine Portable 1 View  Result Date: 11/19/2016 CLINICAL DATA:  Intraoperative localization for spine surgery. EXAM: PORTABLE SPINE - 1 VIEW COMPARISON:  Lumbar radiographs 11/14/2016 FINDINGS: Surgical retractors are noted at the L5 level. There is a surgical instrument pointing towards the L4-5 disc space. IMPRESSION: L4-5 marked intraoperatively. Electronically Signed   By: Rudie Meyer M.D.   On: 11/19/2016 09:23   Dg Spine Portable 1 View  Result Date: 11/19/2016 CLINICAL DATA:  Lumbar decompression EXAM: PORTABLE SPINE - 1 VIEW COMPARISON:  11/14/2016 FINDINGS: Single lateral view of the lumbar spine submitted. There is a posterior localization needle between the spinous process of L3 and L4 vertebral body. Second posterior localization needle more inferiorly between the spinous process of L4 and L5 vertebral body. IMPRESSION: There is a posterior localization needle between the spinous process of L3 and L4 vertebral body. Second posterior localization needle more inferiorly between the spinous process of L4 and L5 vertebral body. Electronically Signed   By: Natasha Mead M.D.   On: 11/19/2016 09:23    EKG:No orders found for this or any previous visit.   Hospital Course: Patient was admitted to [facility] and taken to the OR and underwent the above state procedure  without complications.  Patient tolerated the procedure well and was later transferred to the recovery room and then to the orthopaedic floor for postoperative care.  They were given PO and IV analgesics for pain control following their surgery.  They were given postoperative antibiotics.    PT was consulted postop to assist with mobility and transfers.  The patient was allowed to be WBAT with therapy and was taught back precautions. Discharge planning was consulted to help with postop disposition and equipment needs.  Patient had a good night on the evening of surgery, ambulated well, pain controlled, voiding without difficulty. He was discharged home the same day of surgery after receiving another dose of IV antibiotics. They were given discharge instructions and dressing directions.  They were instructed on when to follow up in the office with Dr. Shelle IronBeane.   Diet: Regular diet Activity:WBAT; LSpine precautions Follow-up:in 10-14 days Disposition - Home Discharged Condition: good   Discharge Instructions    Call MD / Call 911    Complete by:  As directed    If you experience chest pain or shortness of breath, CALL 911 and be transported to the hospital emergency room.  If you develope a fever above 101 F, pus (white drainage) or increased drainage or redness at the wound, or calf pain, call your surgeon's office.   Constipation Prevention    Complete by:  As directed    Drink plenty of fluids.  Prune juice may be helpful.  You may use a stool softener, such as Colace (over the counter) 100 mg twice a day.  Use MiraLax (over the counter) for constipation as needed.   Diet - low sodium heart healthy    Complete by:  As directed    Increase activity slowly as tolerated    Complete by:  As directed      Allergies as of 11/19/2016   No Known Allergies     Medication List    STOP taking these medications   cyclobenzaprine 5 MG tablet Commonly known as:  FLEXERIL     TAKE these medications   docusate sodium 100 MG capsule Commonly known as:  COLACE Take 1 capsule (100 mg total) by mouth 2 (two) times daily as needed for mild constipation.   gabapentin 300 MG capsule Commonly known as:  NEURONTIN Take 300 mg by mouth at bedtime as needed.   methocarbamol 500 MG  tablet Commonly known as:  ROBAXIN Take 1 tablet (500 mg total) by mouth every 6 (six) hours as needed for muscle spasms.   oxyCODONE-acetaminophen 10-325 MG tablet Commonly known as:  PERCOCET Take 1 tablet by mouth every 4 (four) hours as needed for pain (severe).   polyethylene glycol packet Commonly known as:  MIRALAX / GLYCOLAX Take 17 g by mouth daily.      Follow-up Information    BEANE,JEFFREY C, MD Follow up in 2 week(s).   Specialty:  Orthopedic Surgery Contact information: [address] [place] [address] [phone]           Signed: Andrez GrimeJaclyn Latrisha Coiro, PA-C Orthopaedic Surgery 11/19/2016, 4:09 PM

## 2016-11-19 NOTE — Evaluation (Signed)
Physical Therapy Evaluation Patient Details Name: Shannon Norris R Steinmiller MRN: [id number] DOB: 06/08/1971 Today's Date: 11/19/2016   History of Present Illness  HNP surgery  Clinical Impression  The patient is mobilizing well. Plans DC today.    Follow Up Recommendations No PT follow up    Equipment Recommendations  None recommended by PT    Recommendations for Other Services       Precautions / Restrictions Precautions Precautions: Back      Mobility  Bed Mobility Overal bed mobility: Needs Assistance Bed Mobility: Rolling Rolling: Supervision         General bed mobility comments: cues on technique  Transfers Overall transfer level: Needs assistance Equipment used: Rolling walker (2 wheeled) Transfers: Sit to/from Stand Sit to Stand: Min guard            Ambulation/Gait Ambulation/Gait assistance: Min guard Ambulation Distance (Feet): 400 Feet Assistive device: None Gait Pattern/deviations: Step-through pattern        Stairs Stairs: Yes Stairs assistance: Supervision Stair Management: One rail Right Number of Stairs: 2    Wheelchair Mobility    Modified Rankin (Stroke Patients Only)       Balance                                             Pertinent Vitals/Pain Pain Assessment: 0-10 Pain Score: 4  Pain Location: back Pain Descriptors / Indicators: Sore Pain Intervention(s): Monitored during session;Premedicated before session    Home Living Family/patient expects to be discharged to:: Private residence Living Arrangements: Spouse/significant other Available Help at Discharge: Family Type of Home: House Home Access: Level entry     Home Layout: Two level Home Equipment: Medical laboratory scientific officerCane - single point      Prior Function Level of Independence: Independent with assistive device(s)               Hand Dominance        Extremity/Trunk Assessment   Upper Extremity Assessment Upper Extremity Assessment: Overall WFL  for tasks assessed    Lower Extremity Assessment Lower Extremity Assessment: Overall WFL for tasks assessed    Cervical / Trunk Assessment Cervical / Trunk Assessment: Normal  Communication   Communication: No difficulties  Cognition Arousal/Alertness: Awake/alert Behavior During Therapy: WFL for tasks assessed/performed Overall Cognitive Status: Within Functional Limits for tasks assessed                      General Comments      Exercises     Assessment/Plan    PT Assessment Patent does not need any further PT services  PT Problem List            PT Treatment Interventions      PT Goals (Current goals can be found in the Care Plan section)  Acute Rehab PT Goals Patient Stated Goal: go home PT Goal Formulation: All assessment and education complete, DC therapy    Frequency     Barriers to discharge        Co-evaluation               End of Session   Activity Tolerance: Patient tolerated treatment well Patient left: in chair;with call bell/phone within reach;with family/visitor present Nurse Communication: Mobility status    Functional Assessment Tool Used: clinical judgement Functional Limitation: Mobility: Walking and moving around Mobility: Walking and Moving  Around Current Status (Z6109): At least 1 percent but less than 20 percent impaired, limited or restricted Mobility: Walking and Moving Around Goal Status [phone]): At least 1 percent but less than 20 percent impaired, limited or restricted Mobility: Walking and Moving Around Discharge Status [phone]): At least 1 percent but less than 20 percent impaired, limited or restricted    Time: 9147-8295 PT Time Calculation (min) (ACUTE ONLY): 23 min   Charges:   PT Evaluation $PT Eval Low Complexity: 1 Procedure PT Treatments $Gait Training: 8-22 mins   PT G Codes:   PT G-Codes **NOT FOR INPATIENT CLASS** Functional Assessment Tool Used: clinical judgement Functional Limitation:  Mobility: Walking and moving around Mobility: Walking and Moving Around Current Status (A2130): At least 1 percent but less than 20 percent impaired, limited or restricted Mobility: Walking and Moving Around Goal Status [phone]): At least 1 percent but less than 20 percent impaired, limited or restricted Mobility: Walking and Moving Around Discharge Status [phone]): At least 1 percent but less than 20 percent impaired, limited or restricted    Rada Hay 11/19/2016, 5:15 PM

## 2016-11-19 NOTE — Discharge Instructions (Signed)

## 2016-11-19 NOTE — Anesthesia Procedure Notes (Signed)
Procedure Name: Intubation Date/Time: 11/19/2016 8:43 AM Performed by: Maxwell Caul Pre-anesthesia Checklist: Patient identified, Emergency Drugs available, Suction available and Patient being monitored Patient Re-evaluated:Patient Re-evaluated prior to inductionOxygen Delivery Method: Circle system utilized Preoxygenation: Pre-oxygenation with 100% oxygen Intubation Type: IV induction Ventilation: Mask ventilation without difficulty Laryngoscope Size: Mac and 4 Grade View: Grade I Tube type: Oral Tube size: 7.5 mm Number of attempts: 1 Airway Equipment and Method: Stylet Placement Confirmation: ETT inserted through vocal cords under direct vision,  positive ETCO2 and breath sounds checked- equal and bilateral Secured at: 23 cm Tube secured with: Tape Dental Injury: Teeth and Oropharynx as per pre-operative assessment

## 2016-11-19 NOTE — Anesthesia Postprocedure Evaluation (Signed)
Anesthesia Post Note  Patient: Shannon Norris  Procedure(s) Performed: Procedure(s) (LRB): MICRO LUMBAR DECOMPRESSION L4 - L5 RIGHT 1 LEVEL (Right)  Patient location during evaluation: PACU Anesthesia Type: General Level of consciousness: awake and alert Pain management: pain level controlled Vital Signs Assessment: post-procedure vital signs reviewed and stable Respiratory status: spontaneous breathing, nonlabored ventilation, respiratory function stable and patient connected to nasal cannula oxygen Cardiovascular status: blood pressure returned to baseline and stable Postop Assessment: no signs of nausea or vomiting Anesthetic complications: no       Last Vitals:  Vitals:   11/19/16 1045 11/19/16 1100  BP: 130/70 128/74  Pulse: 72 64  Resp: 15 14  Temp:  36.9 C    Last Pain:  Vitals:   11/19/16 1045  TempSrc:   PainSc: 2                  Cecile HearingStephen Edward Turk

## 2016-11-20 NOTE — Op Note (Signed)
NAME:  WILL, HEINKEL NO.:  MEDICAL RECORD NO.:  [id number]  LOCATION:                                 FACILITY:  PHYSICIAN:  Jene Every, M.D.    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  POSTOPERATIVE DIAGNOSIS:  Spinal stenosis, herniated nucleus pulposus at L4-L5, right.  PROCEDURE PERFORMED: 1. Microlumbar decompression L4-L5, right. 2. Foraminotomies L4-L5, right. 3. Microdiskectomy L4-L5, right.  ANESTHESIA:  General.  ASSISTANT:  Lanna Poche, PA.  HISTORY:  A 46 year old, right lower extremity radicular pain secondary to disk herniations refractory to conservative treatment; myotomal weakness, dermatomal dysesthesias, EHL weakness.  The patient was indicated for microlumbar decompression at L4-L5.  Risk and benefits were discussed including bleeding, infection, damage to the neurovascular structures, no change in symptoms, worsening symptoms, DVT, PE, anesthetic complications, etc.  TECHNIQUE:  With the patient in supine position, after induction of adequate general anesthesia, 2 g Kefzol, placed prone on the [place] frame.  All bony prominences were well padded.  Lumbar region was prepped and draped in usual sterile fashion.  Two 18-gauge spinal needle was utilized to localize L4-L5 interspace and confirmed with x-ray. Incision was made from spinous process 4-5.  Subcutaneous tissue was dissected, with electrocautery was utilized to achieve hemostasis. Paraspinous muscles elevated.  McCullough retractor was placed. Confirmatory radiograph obtained.  Operating microscope was draped and brought on the surgical field.  Hemilaminotomy of the caudad edge of L4 was performed with a 2 and 3-mm Kerrison preserving the pars. Ligamentum flavum detached from the  cephalad edge of 5 utilizing straight curette.  Neuro patty placed beneath the ligamentum flavum. Foraminotomy of 5 was performed protecting the 5 root at all times.  I gently mobilized the 5 root medially, protecting it, decompressed lateral recess in the medial border of pedicle with hypertrophic ligamentum flavum noted and facet hypertrophy creating stenosis.  There was a very extensive epidural venous plexus noted tethering the 5 root and this was cauterized and lysed.  Further freeing the L5 nerve root, a focal HNP was noted.  An annulotomy performed and copious portion of disk material was removed from the disk space with straight and upbiting pituitary, taking care to limit the depth of the pituitary to the base of the hinge.  Next, we irrigated the disk space with antibiotic irrigation by catheter lavage to retrieve additional fragments. Fragment that migrated caudad noted to be on the MRI, this was retrieved as well.  I then checked beneath the thecal sac, the axilla, the foramen of 4 and 5.  We had performed a foraminotomy of 4 as well as there was hypertrophic ligamentum and facet creating some stenosis here. Protecting the 4 root at all time, neural probe passed freely at the foramen of 4 and 5.  There was 1 cm of excursion of the 5 root medial to the pedicle without tension.  No disk herniation within the axilla, beneath the thecal sac, the shoulder of the root, or within the foramen. I  had to re-cauterize the epidural vein and we had to place the thrombin- soaked Gelfoam on it.  With a neural patty, we obtained a confirmatory radiograph.  I then removed it and cauterized once again.  There was no active bleeding and placed thrombin-soaked Gelfoam on that.  No evidence CSF leakage or active bleeding.  We removed the McCullough retractor and had no active bleeding.  We irrigated and closed the dorsolumbar fascia with 1 Vicryl, subcu with 2-0, and skin with Prolene.  Sterile  dressing applied.  Placed supine on the hospital bed, extubated without difficulty, and transported to the recovery room in satisfactory condition.  The patient tolerated the procedure well.  No complications.  Assistant, Lanna PocheJacqueline Bissell, PA was used throughout the case for the patient's positioning, gentle intermittent neural traction, suction, and the closure.     Jene EveryJeffrey Quetzally Callas, M.D.     Cordelia PenJB/MEDQ  D:  11/19/2016  T:  11/19/2016  Job:  [id number]

## 2017-12-31 IMAGING — CR DG LUMBAR SPINE 2-3V
2 series · 2 of 2 positions shown · non-contrast
Comparison: None.

ADDENDUM:
No change in interpretation. Please see presentation state labeled
"AP" to load the images where the lumbar spine is labeled in the AP
view, if those images do not automatically load.
CLINICAL DATA: Preoperative film for surgery scheduled November 19, 2016

EXAM:
LUMBAR SPINE - 2-3 VIEW

[w l-spine a.p. *]
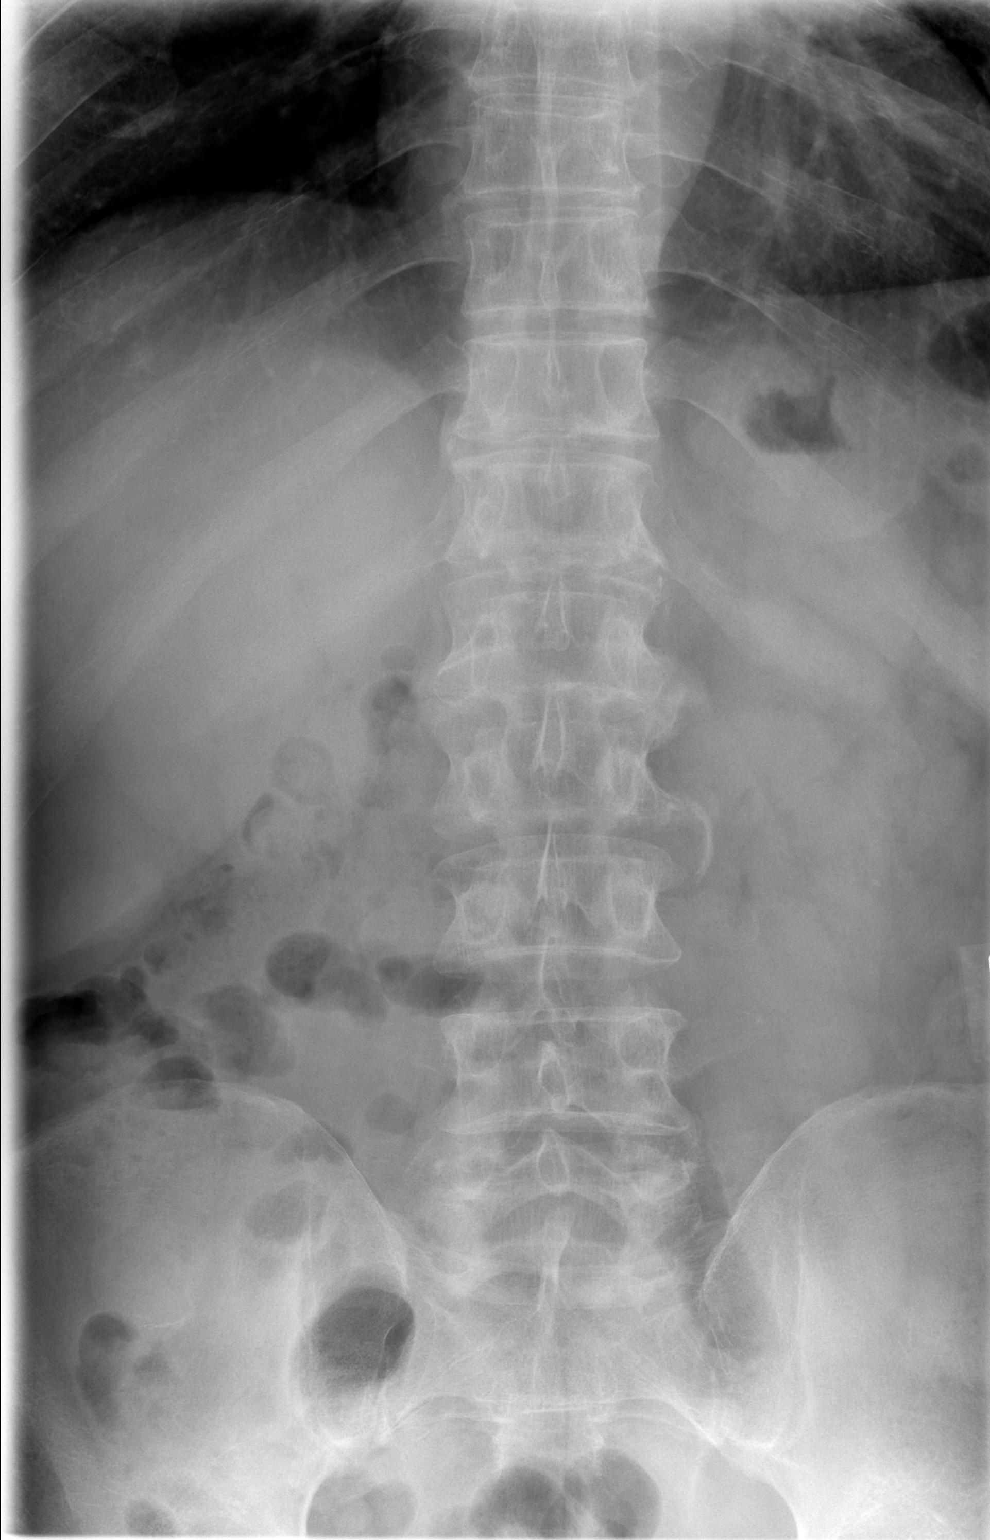

[w l-spine lat]
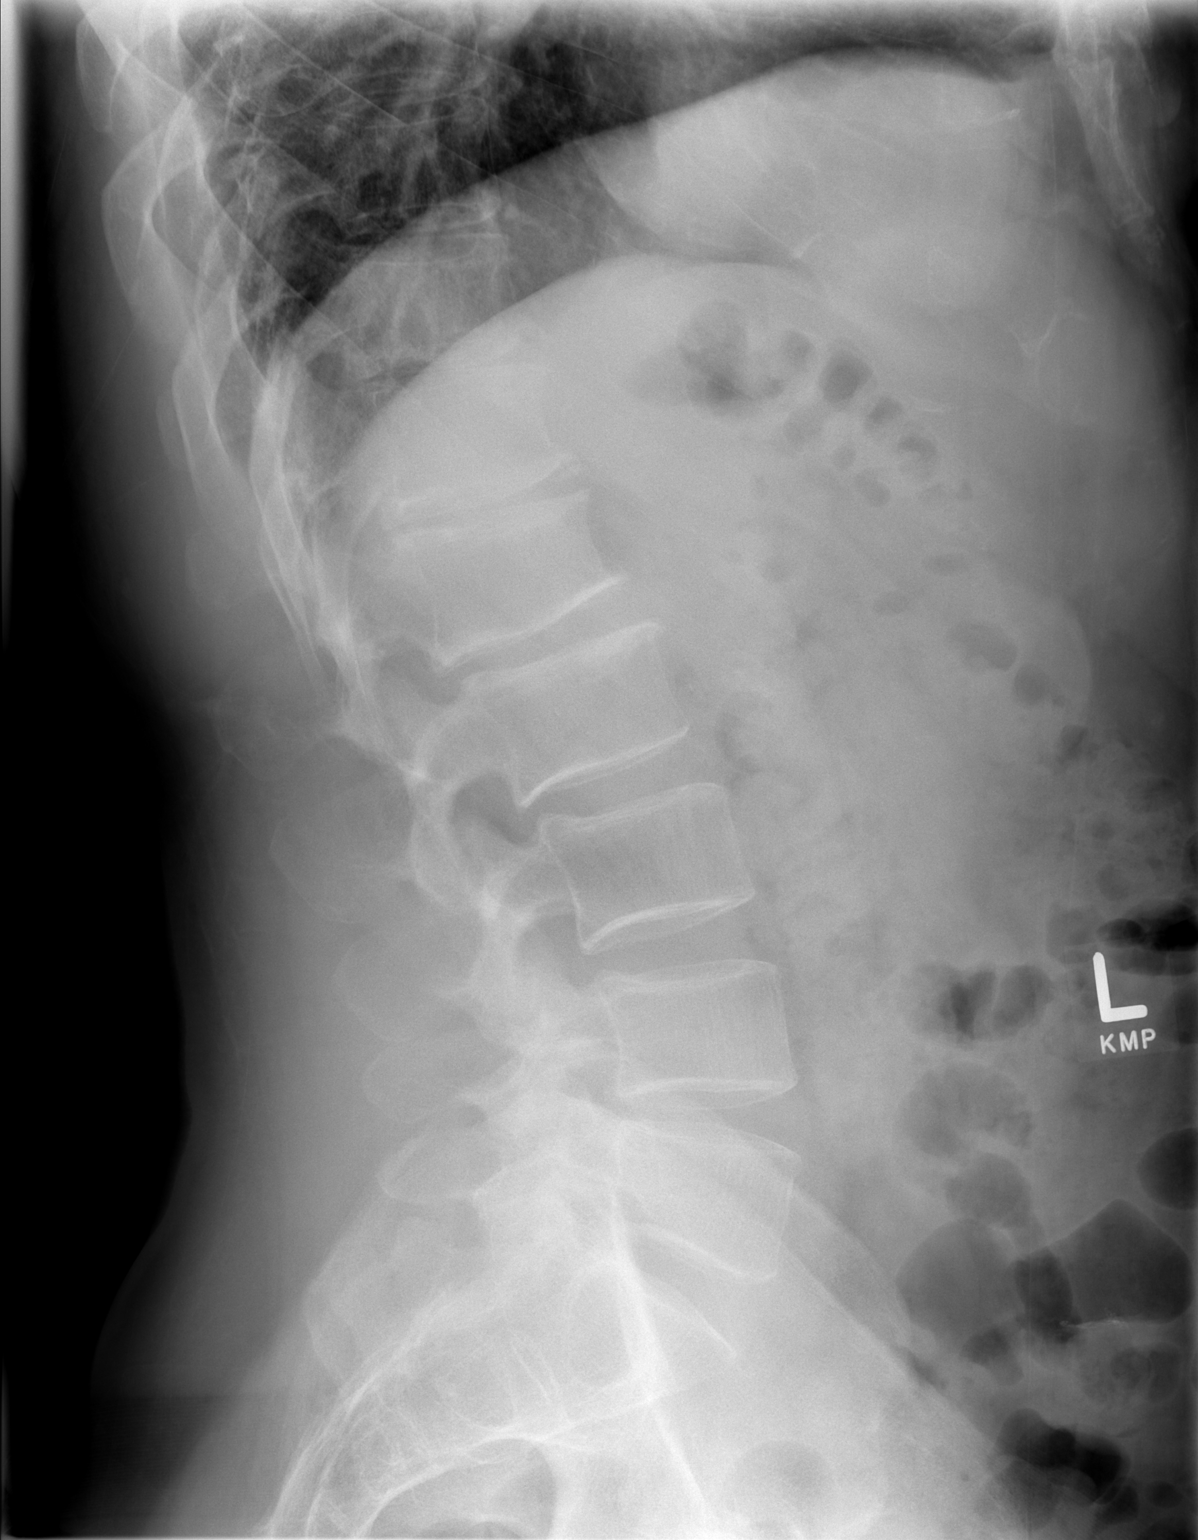

[2 of 2 positions shown; findings below may reference images not displayed]

FINDINGS: There is no evidence of lumbar spine fracture. Alignment is normal.
There is decreased intervertebral space in the lower thoracic spine.
Mild anterior chronic compression deformity of L1 is noted.
IMPRESSION: Mild degenerative joint changes as described.

## 2018-01-05 IMAGING — DX DG SPINE 1V PORT
1 series · 1 of 1 positions shown · non-contrast
Comparison: Lumbar radiographs 11/14/2016

CLINICAL DATA: Intraoperative localization for spine surgery.

EXAM:
PORTABLE SPINE - 1 VIEW

[l-spine lat]
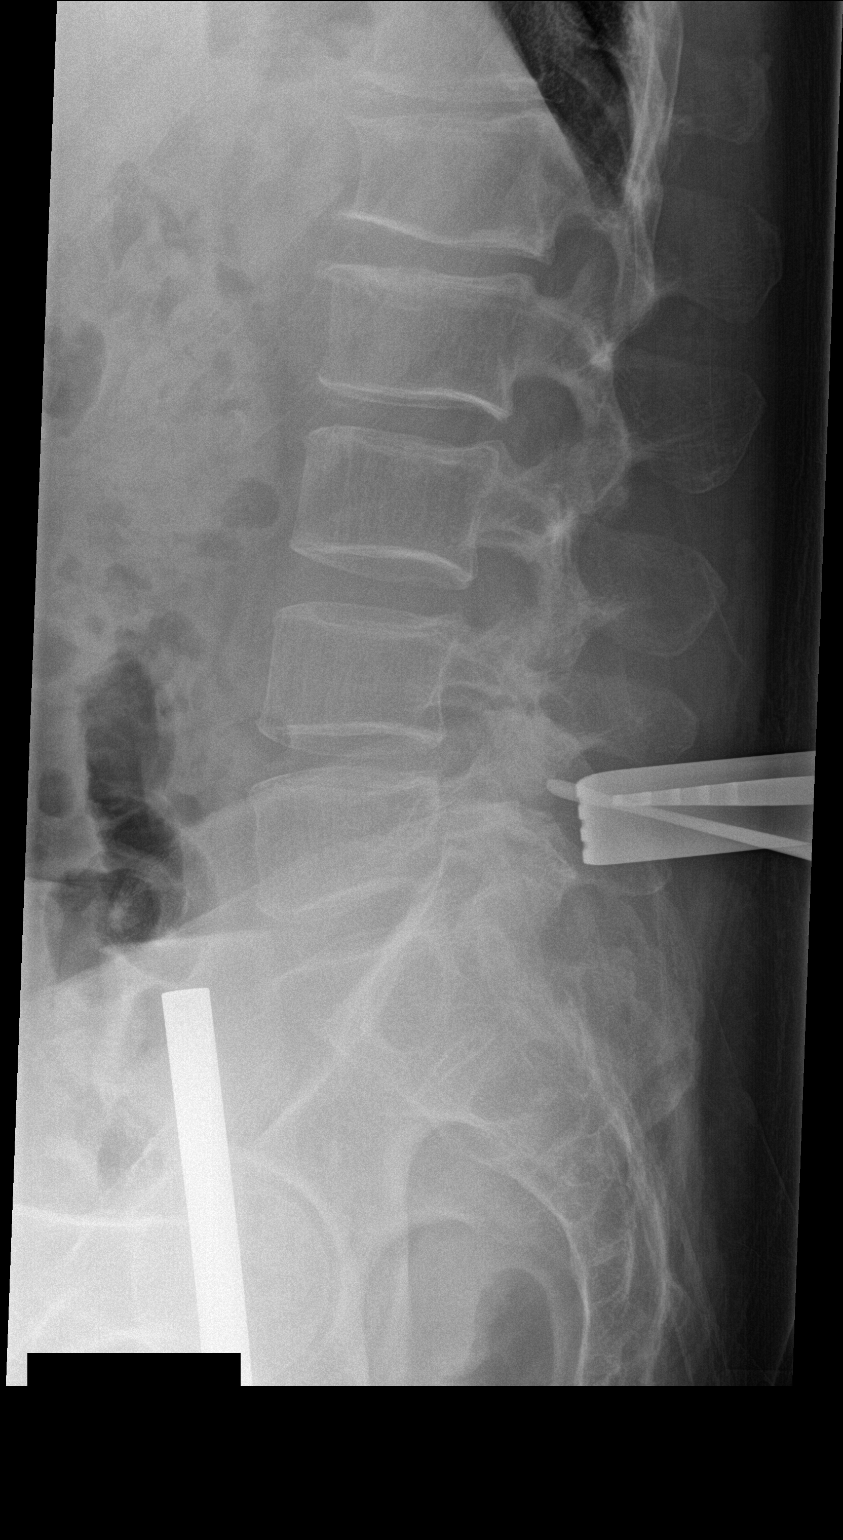

[1 of 1 positions shown; findings below may reference images not displayed]

FINDINGS: Surgical retractors are noted at the L5 level. There is a surgical
instrument pointing towards the L4-5 disc space.
IMPRESSION: L4-5 marked intraoperatively.

## 2018-01-05 IMAGING — DX DG SPINE 1V PORT
1 series · 1 of 1 positions shown · non-contrast
Comparison: Intraoperative film 2 from today and lumbar spine films
of 11/14/2016

CLINICAL DATA: Intraoperative film 3 for localization

EXAM:
PORTABLE SPINE - 1 VIEW

[l-spine lat]
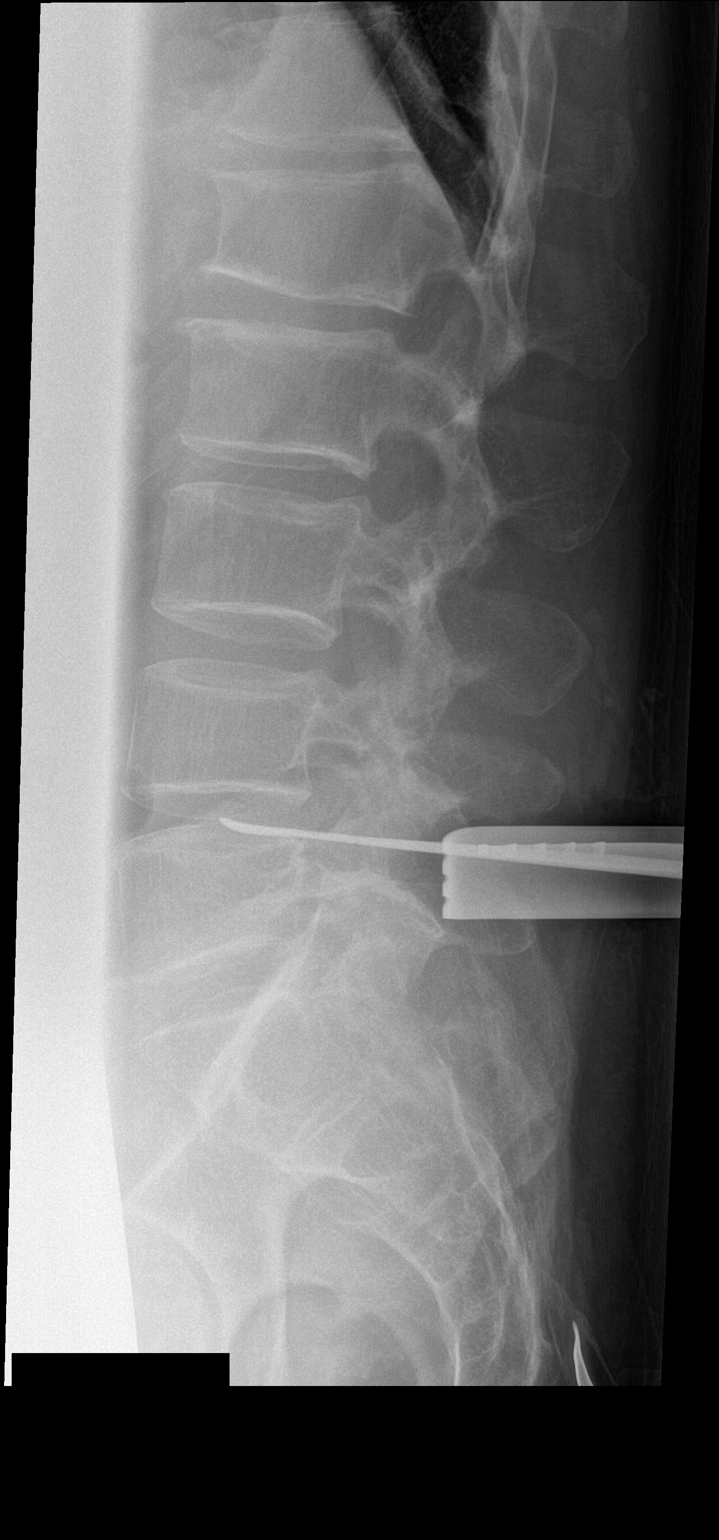

[1 of 1 positions shown; findings below may reference images not displayed]

FINDINGS: The instrument for localization extends to the L4-5 interspace. No
change in alignment is seen.
IMPRESSION: Localization of L4-5 on film 3.

## 2018-01-05 IMAGING — DX DG SPINE 1V PORT
1 series · 1 of 1 positions shown · non-contrast
Comparison: 11/14/2016

CLINICAL DATA: Lumbar decompression

EXAM:
PORTABLE SPINE - 1 VIEW

[l-spine lat]
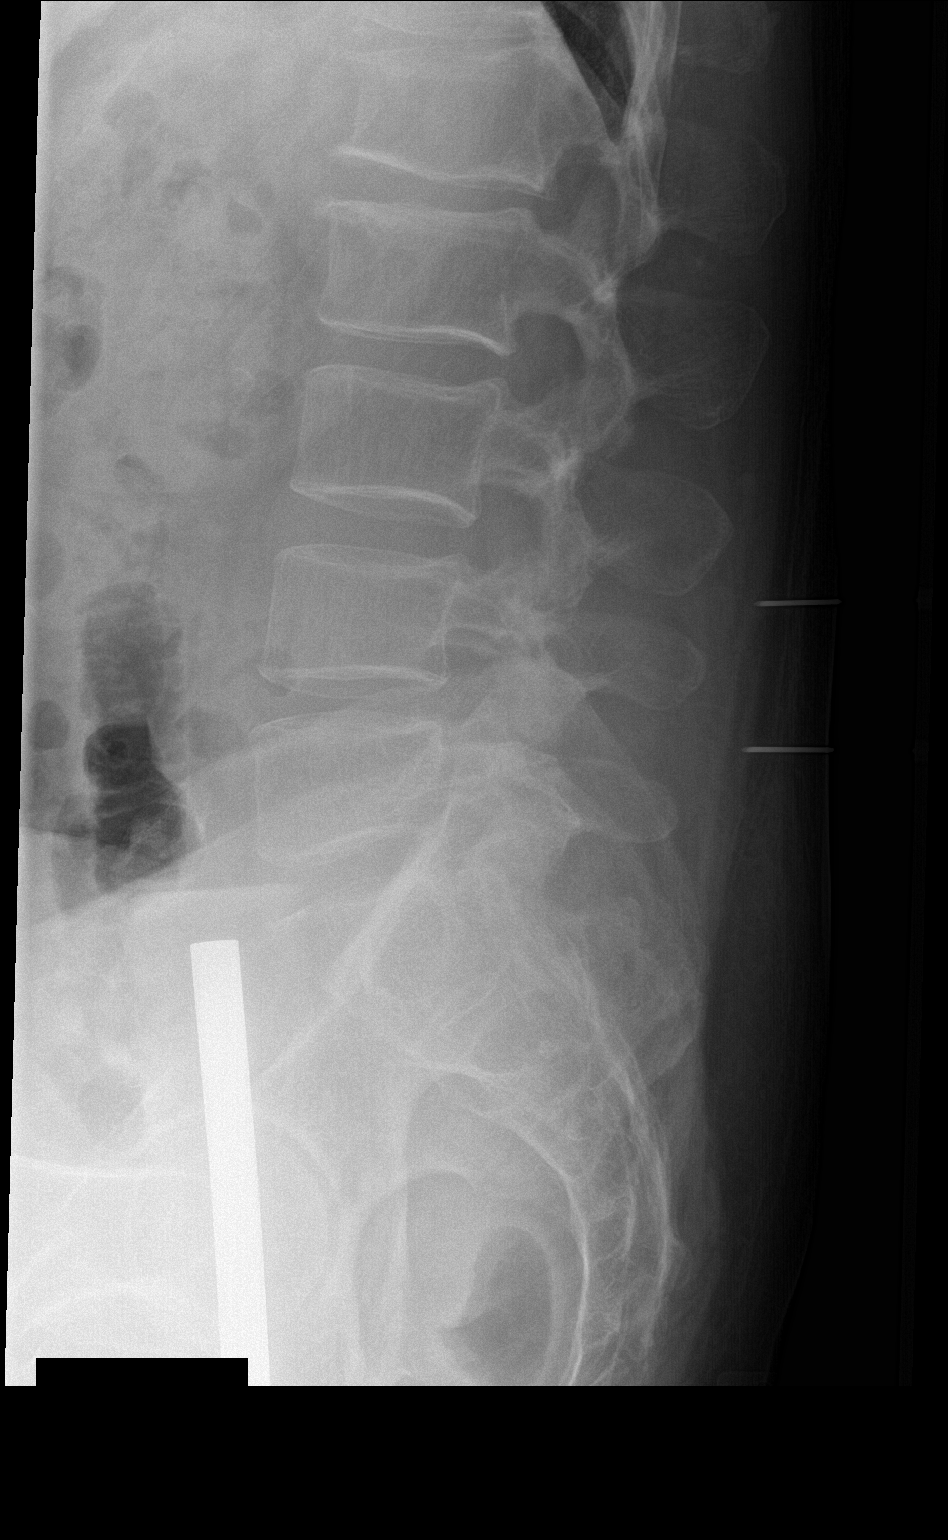

[1 of 1 positions shown; findings below may reference images not displayed]

FINDINGS: Single lateral view of the lumbar spine submitted. There is a
posterior localization needle between the spinous process of L3 and
L4 vertebral body. Second posterior localization needle more
inferiorly between the spinous process of L4 and L5 vertebral body.
IMPRESSION: There is a posterior localization needle between the spinous process
of L3 and L4 vertebral body. Second posterior localization needle
more inferiorly between the spinous process of L4 and L5 vertebral
body.
